# Patient Record
Sex: Female | Born: 1950 | Race: Black or African American | Hispanic: No | Marital: Married | Smoking: Former smoker
Health system: Southern US, Community
[De-identification: ages and names within clinical notes are randomized; demographics above are authoritative.]

## PROBLEM LIST (undated history)

## (undated) DIAGNOSIS — I10 Essential (primary) hypertension: Secondary | ICD-10-CM

## (undated) DIAGNOSIS — C801 Malignant (primary) neoplasm, unspecified: Secondary | ICD-10-CM

## (undated) DIAGNOSIS — H548 Legal blindness, as defined in USA: Secondary | ICD-10-CM

## (undated) HISTORY — DX: Malignant (primary) neoplasm, unspecified: C80.1

## (undated) HISTORY — PX: HERNIA REPAIR: SHX51

## (undated) HISTORY — DX: Legal blindness, as defined in USA: H54.8

## (undated) HISTORY — PX: BREAST SURGERY: SHX581

## (undated) HISTORY — DX: Essential (primary) hypertension: I10

---

## 2011-12-07 ENCOUNTER — Other Ambulatory Visit: Payer: Self-pay | Admitting: Obstetrics & Gynecology

## 2011-12-07 DIAGNOSIS — N949 Unspecified condition associated with female genital organs and menstrual cycle: Secondary | ICD-10-CM

## 2011-12-12 ENCOUNTER — Ambulatory Visit (HOSPITAL_COMMUNITY)
Admission: RE | Admit: 2011-12-12 | Discharge: 2011-12-12 | Disposition: A | Discharge status: Home / Self Care | Payer: Federal, State, Local not specified - PPO | Source: Ambulatory Visit | Attending: Obstetrics & Gynecology | Admitting: Obstetrics & Gynecology

## 2011-12-12 DIAGNOSIS — Z853 Personal history of malignant neoplasm of breast: Secondary | ICD-10-CM | POA: Insufficient documentation

## 2011-12-12 DIAGNOSIS — Z90710 Acquired absence of both cervix and uterus: Secondary | ICD-10-CM | POA: Insufficient documentation

## 2011-12-12 DIAGNOSIS — N949 Unspecified condition associated with female genital organs and menstrual cycle: Secondary | ICD-10-CM | POA: Insufficient documentation

## 2011-12-12 DIAGNOSIS — R1032 Left lower quadrant pain: Secondary | ICD-10-CM | POA: Insufficient documentation

## 2011-12-22 ENCOUNTER — Encounter (INDEPENDENT_AMBULATORY_CARE_PROVIDER_SITE_OTHER): Payer: Self-pay | Admitting: General Surgery

## 2012-01-03 ENCOUNTER — Ambulatory Visit (INDEPENDENT_AMBULATORY_CARE_PROVIDER_SITE_OTHER): Payer: Federal, State, Local not specified - PPO | Admitting: General Surgery

## 2012-01-03 ENCOUNTER — Encounter (INDEPENDENT_AMBULATORY_CARE_PROVIDER_SITE_OTHER): Payer: Self-pay | Admitting: General Surgery

## 2012-01-03 VITALS — BP 142/84 | HR 76 | Temp 97.4°F | Resp 16 | Ht 65.0 in | Wt 212.4 lb

## 2012-01-03 DIAGNOSIS — K429 Umbilical hernia without obstruction or gangrene: Secondary | ICD-10-CM

## 2012-01-03 NOTE — Progress Notes (Signed)
Subjective:     Patient ID: Lori Berry, female   DOB: November 21, 1950, 61 y.o.   MRN: [id number]  HPI The patient is 61 year old at 15 month history of left lateral abdominal wall fullness. Patient seen several physicians to have this evaluated. Patient has undergone ultrasound to evaluate her left ovary which is not cause of her fullness. Patient is a previous MRI in the past which according to the patient has revealed a left inguinal hernia. Patient she has no pain at that site but does recognize the fullness when she bends down. Patient also states she does have a herniated disc seen a neurologist and neurosurgeon for steroid injections.  Review of Systems  Constitutional: Negative.   HENT: Negative.   Eyes: Negative.   Respiratory: Negative.   Cardiovascular: Negative.   Gastrointestinal: Positive for abdominal pain (fullness to L lateral abd wall).  Neurological: Negative.        Objective:   Physical Exam  Constitutional: She is oriented to person, place, and time. She appears well-developed and well-nourished.  HENT:  Head: Normocephalic and atraumatic.  Eyes: Pupils are equal, round, and reactive to light.  Neck: Normal range of motion. Neck supple.  Cardiovascular: Normal rate and regular rhythm.   Pulmonary/Chest: Effort normal and breath sounds normal.  Abdominal: Soft. She exhibits no distension and no mass. There is no tenderness. There is no rebound and no guarding.  Musculoskeletal: Normal range of motion.  Neurological: She is alert and oriented to person, place, and time.       Assessment:     The patient is a 61 year old female with umbilical hernia and possible left abdominal wall hernia    Plan:     1. Patient is to obtain her previous MRI  in clinic for review. He is unable to attain this we will proceed with CT scan of the abdomen and pelvis.  2. If unable to localize the lateral abdominal hernial repair laparoscopically.

## 2012-01-18 ENCOUNTER — Ambulatory Visit (INDEPENDENT_AMBULATORY_CARE_PROVIDER_SITE_OTHER): Payer: Federal, State, Local not specified - PPO | Admitting: General Surgery

## 2012-01-18 ENCOUNTER — Encounter (INDEPENDENT_AMBULATORY_CARE_PROVIDER_SITE_OTHER): Payer: Self-pay | Admitting: General Surgery

## 2012-01-18 VITALS — BP 148/76 | HR 84 | Temp 97.7°F | Resp 20 | Ht 65.0 in | Wt 211.0 lb

## 2012-01-18 DIAGNOSIS — K429 Umbilical hernia without obstruction or gangrene: Secondary | ICD-10-CM

## 2012-01-18 NOTE — Progress Notes (Signed)
Patient ID: Lori Berry, female   DOB: [birth year], 61 y.o.   MRN: [id number]  Chief Complaint  Patient presents with  . Routine Post Op    hernia    HPI Lori Berry is a 61 y.o. female returns today with a CT of her CT abdomen and pelvis for evaluation of a left-sided fullness and she feels. Patient states this started several months ago and feels like there is a possible hernia to the left lateral abdominal wall. Upon review the CT scan patient has an umbilical fat-containing hernia however does not have any lateral abdominal wall hernia. I reviewed the findings with the patient and she wished to proceed with umbilical hernia present this time.  HPI  Past Medical History  Diagnosis Date  . Diabetes mellitus   . Hypertension   . Cancer     breast  . Legally blind     Past Surgical History  Procedure Date  . Cesarean section   . Breast surgery     lumpectomy    Family History  Problem Relation Age of Onset  . Heart failure Father     Social History History  Substance Use Topics  . Smoking status: Never Smoker   . Smokeless tobacco: Not on file  . Alcohol Use: No    Allergies  Allergen Reactions  . Morphine And Related   . Prednisone   . Sulfa Antibiotics     Current Outpatient Prescriptions  Medication Sig Dispense Refill  . amLODipine (NORVASC) 10 MG tablet Take 10 mg by mouth daily.      Marland Kitchen buPROPion (WELLBUTRIN) 100 MG tablet Take 100 mg by mouth as needed.       Marland Kitchen PRODIGY NO CODING BLOOD GLUC test strip       . simvastatin (ZOCOR) 10 MG tablet Take 10 mg by mouth at bedtime.        Review of Systems Review of Systems  Constitutional: Negative.   HENT: Negative.   Eyes: Negative.   Respiratory: Negative.   Cardiovascular: Negative.   Gastrointestinal: Negative.   Neurological: Negative.     Blood pressure 148/76, pulse 84, temperature 97.7 F (36.5 C), temperature source Temporal, resp. rate 20, height 5\' 5"  (1.651 m), weight 211 lb  (95.709 kg).  Physical Exam Physical Exam  Constitutional: She is oriented to person, place, and time. She appears well-developed and well-nourished.  HENT:  Head: Normocephalic and atraumatic.  Eyes: Conjunctivae normal and EOM are normal. Pupils are equal, round, and reactive to light.  Neck: Normal range of motion. Neck supple.  Cardiovascular: Normal rate and regular rhythm.   Pulmonary/Chest: Effort normal and breath sounds normal.  Abdominal: Soft. Bowel sounds are normal.         1 cm umbilical hernia with reducible fat.  Neurological: She is alert and oriented to person, place, and time.    Data Reviewed Outside CT scan of her abdomen and pelvis reveals fat-containing umbilical hernia.  Assessment    61 year old female with umbilical hernia approximately 1/2 cm in size.    Plan    1.  We'll proceed with laparoscopic umbilical hernia with mesh. 2.  All risks and benefits were discussed with the patient, to generally include infection, bleeding, damage to surrounding structures, and recurrence. Alternatives were offered and described.  All questions were answered and the patient voiced understanding of the procedure and wishes to proceed at this point.        [name]., [name] 01/18/2012, 11:12  AM    

## 2012-02-19 DIAGNOSIS — K429 Umbilical hernia without obstruction or gangrene: Secondary | ICD-10-CM

## 2012-03-07 ENCOUNTER — Ambulatory Visit (INDEPENDENT_AMBULATORY_CARE_PROVIDER_SITE_OTHER): Payer: Federal, State, Local not specified - PPO | Admitting: General Surgery

## 2012-03-07 ENCOUNTER — Encounter (INDEPENDENT_AMBULATORY_CARE_PROVIDER_SITE_OTHER): Payer: Self-pay | Admitting: General Surgery

## 2012-03-07 VITALS — BP 120/80 | HR 84 | Temp 97.0°F | Ht 65.0 in | Wt 210.4 lb

## 2012-03-07 DIAGNOSIS — Z9889 Other specified postprocedural states: Secondary | ICD-10-CM

## 2012-03-07 DIAGNOSIS — Z8719 Personal history of other diseases of the digestive system: Secondary | ICD-10-CM

## 2012-03-07 NOTE — Progress Notes (Signed)
Patient ID: Lori Berry, female   DOB: [birth year], 61 y.o.   MRN: [id number] Patient is a 61 year old female approximately 2 weeks out from an umbilical hernia repair. Patient has been doing well postoperatively and is minimal pain at her umbilicus.   Her wounds are clean dry and intact and healing well.She does have a healing ridge underneath her incision sites.  Patient follow back up in one month. Patient is to hold off on heavy lifting for another 4 weeks.

## 2012-04-04 ENCOUNTER — Encounter (INDEPENDENT_AMBULATORY_CARE_PROVIDER_SITE_OTHER): Payer: Federal, State, Local not specified - PPO | Admitting: General Surgery

## 2014-01-12 IMAGING — US US PELVIS COMPLETE
1 series · 14 of 25 positions shown · non-contrast
Comparison: No similar prior study is available for comparison.

CLINICAL DATA: Left lower quadrant pelvic pain.  Remote history of
lumpectomy for breast cancer 6886.  Prior hysterectomy.



[Series 1: us pelvis complete · 14 of 31 slices shown]
[im 1/31]
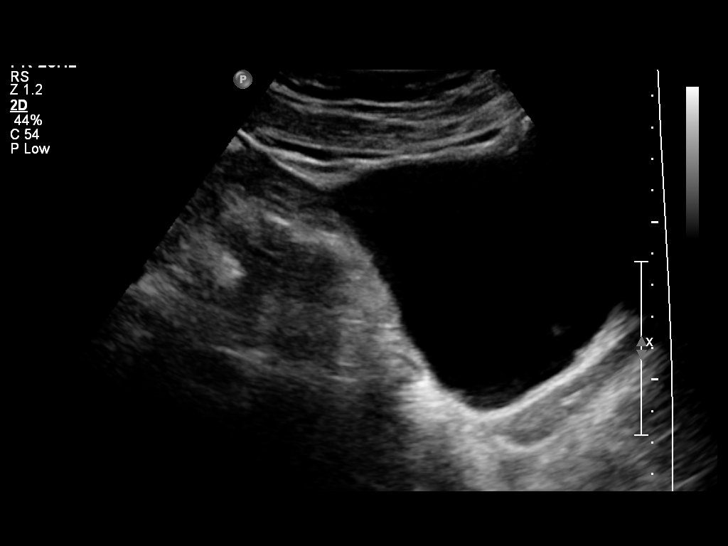
[im 3/31]
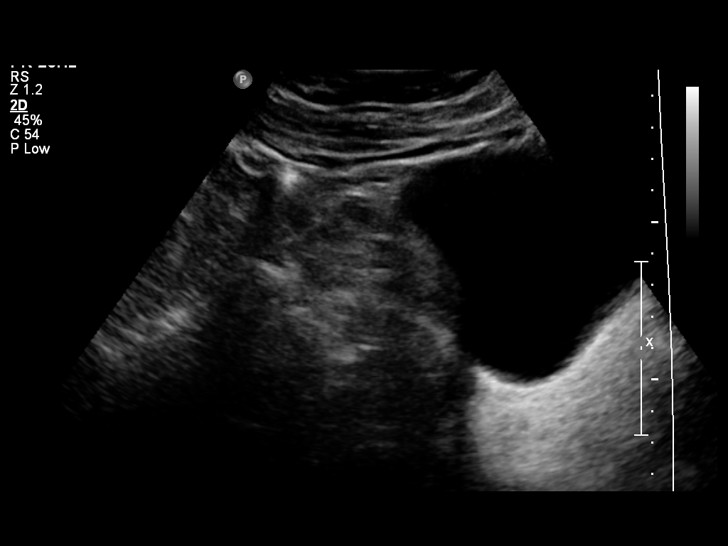
[im 6/31]
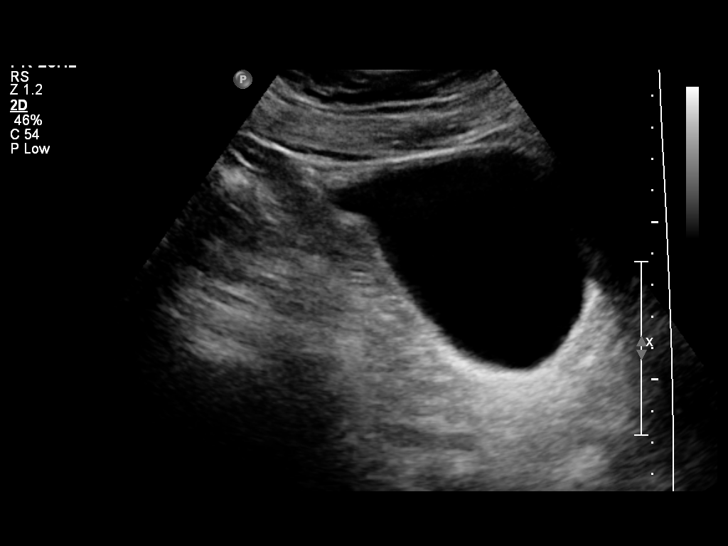
[im 8/31]
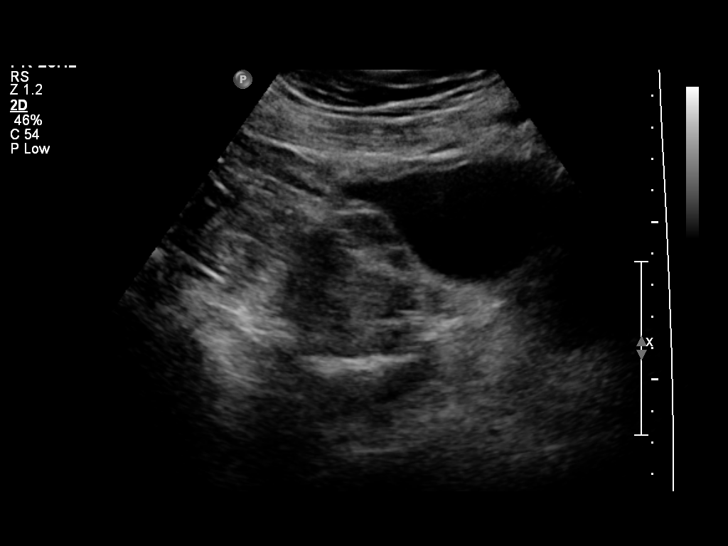
[im 11/31]
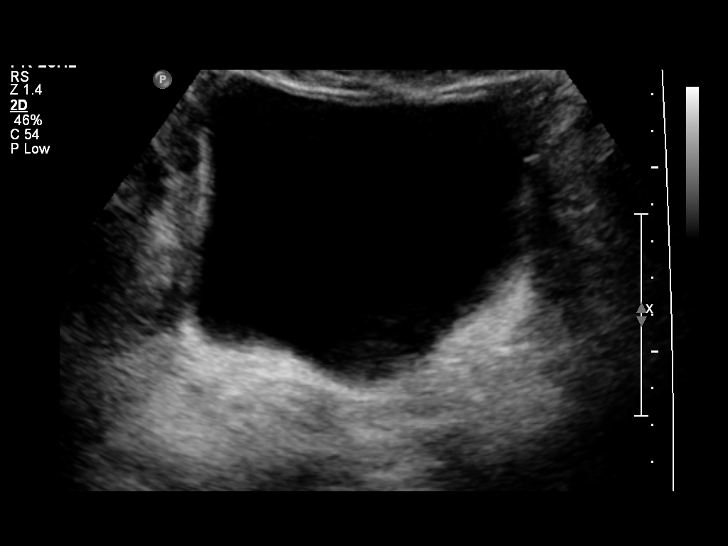
[im 12/31]
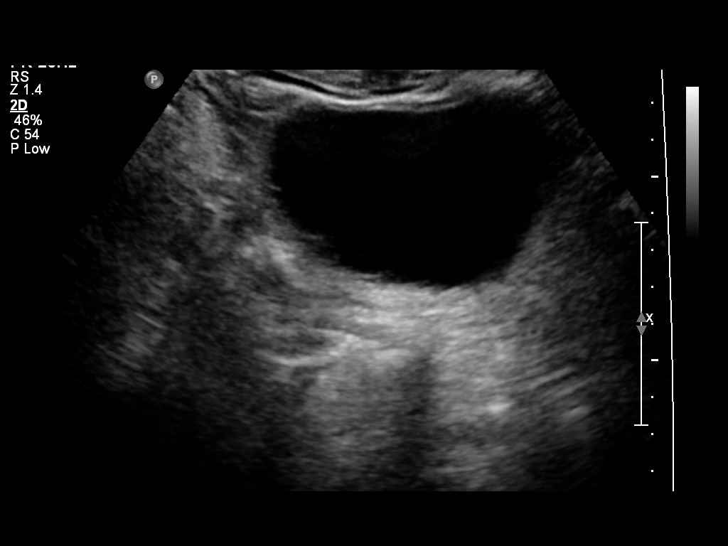
[im 14/31]
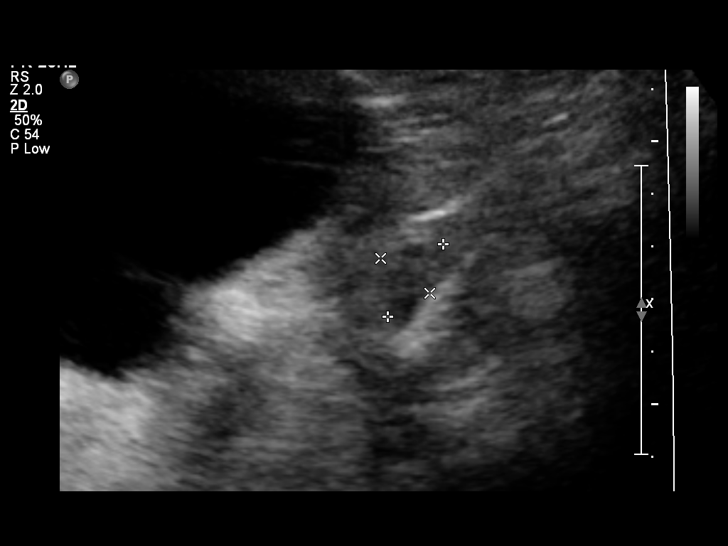
[im 17/31]
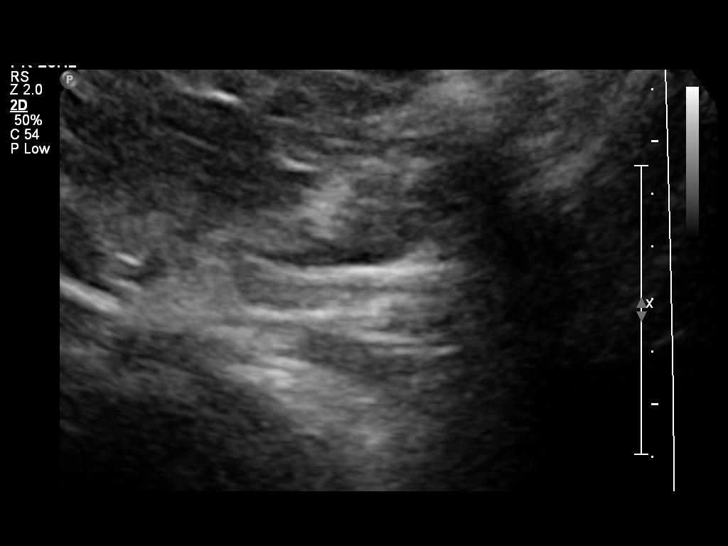
[im 19/31]
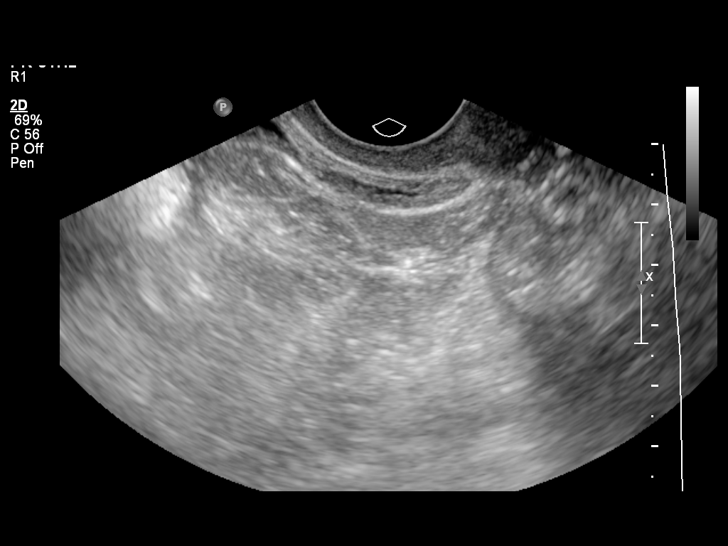
[im 21/31]
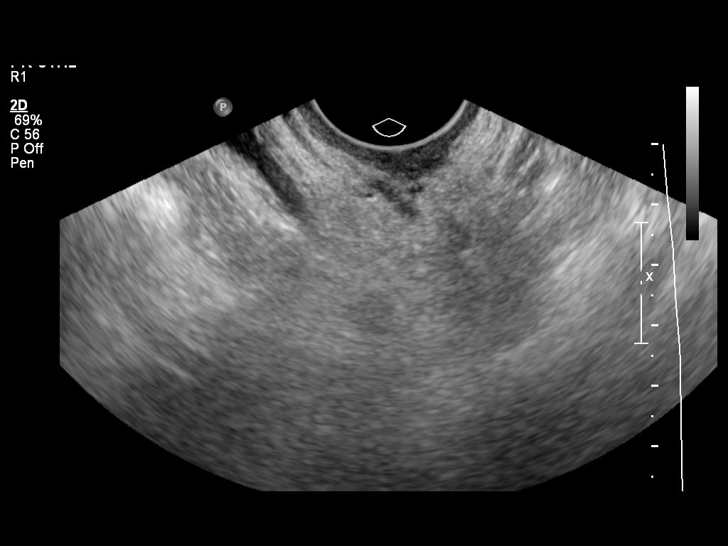
[im 23/31]
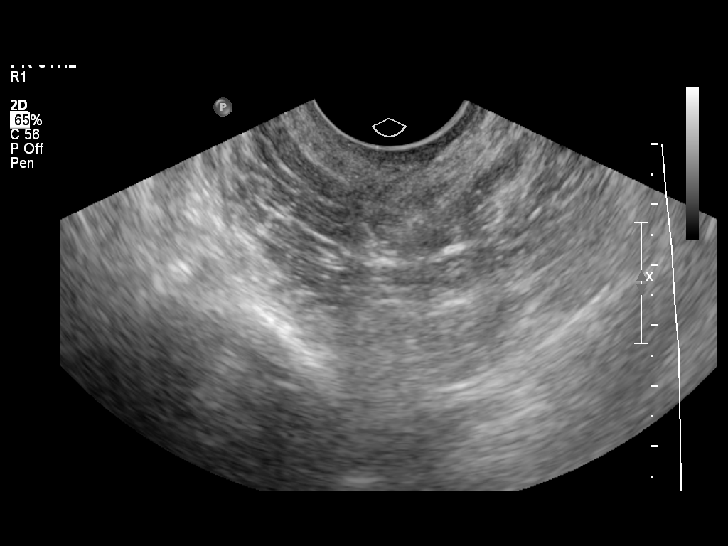
[im 26/31]
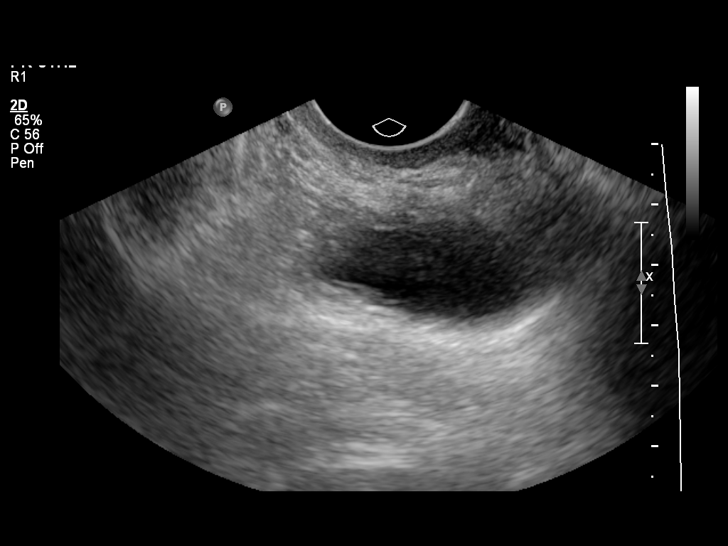
[im 28/31]
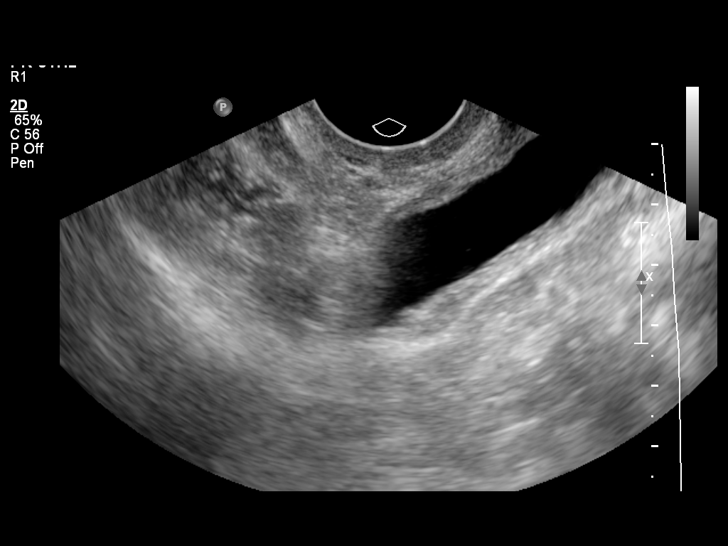
[im 31/31]
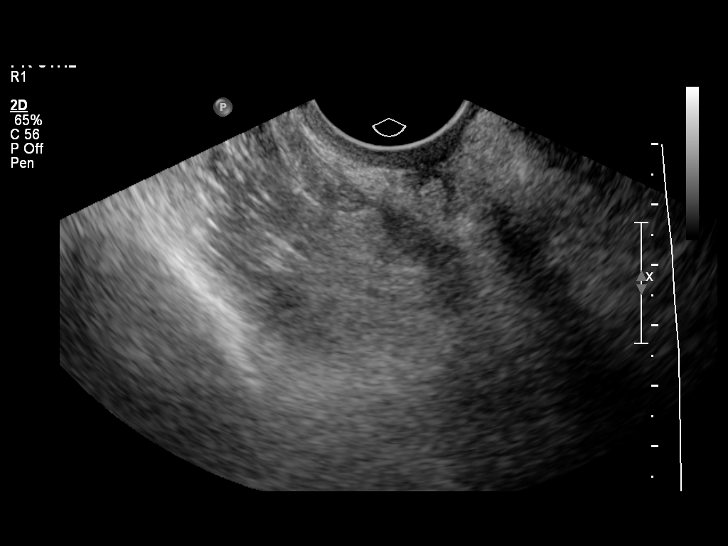

[14 of 25 positions shown; findings below may reference images not displayed]

FINDINGS: Uterus: Surgically absent.  No mass at the vaginal cuff.

Endometrium: Surgically absent.

Right ovary:  Not visualized.  No adnexal mass.

Left ovary: Seen transabdominally only, 1.7 x 1.4 x 1.3 cm.
Normal.

Other findings: No free fluid
IMPRESSION: No etiology for the history of right lower quadrant pelvic pain.
Normal post hysterectomy appearance.

Right ovary not visualized but no right adnexal mass identified..

## 2016-06-17 ENCOUNTER — Encounter (HOSPITAL_BASED_OUTPATIENT_CLINIC_OR_DEPARTMENT_OTHER): Payer: Self-pay

## 2016-06-17 ENCOUNTER — Emergency Department (HOSPITAL_BASED_OUTPATIENT_CLINIC_OR_DEPARTMENT_OTHER)
Admission: EM | Admit: 2016-06-17 | Discharge: 2016-06-17 | Disposition: A | Discharge status: Home / Self Care | Payer: Medicare Other | Attending: Emergency Medicine | Admitting: Emergency Medicine

## 2016-06-17 DIAGNOSIS — Z7984 Long term (current) use of oral hypoglycemic drugs: Secondary | ICD-10-CM | POA: Insufficient documentation

## 2016-06-17 DIAGNOSIS — M545 Low back pain: Secondary | ICD-10-CM | POA: Diagnosis present

## 2016-06-17 DIAGNOSIS — I10 Essential (primary) hypertension: Secondary | ICD-10-CM | POA: Insufficient documentation

## 2016-06-17 DIAGNOSIS — M5441 Lumbago with sciatica, right side: Secondary | ICD-10-CM | POA: Diagnosis not present

## 2016-06-17 DIAGNOSIS — Z79899 Other long term (current) drug therapy: Secondary | ICD-10-CM | POA: Diagnosis not present

## 2016-06-17 DIAGNOSIS — Z853 Personal history of malignant neoplasm of breast: Secondary | ICD-10-CM | POA: Insufficient documentation

## 2016-06-17 DIAGNOSIS — Z87891 Personal history of nicotine dependence: Secondary | ICD-10-CM | POA: Insufficient documentation

## 2016-06-17 DIAGNOSIS — E119 Type 2 diabetes mellitus without complications: Secondary | ICD-10-CM | POA: Insufficient documentation

## 2016-06-17 MED ORDER — CYCLOBENZAPRINE HCL 10 MG PO TABS: 10.0000 mg | ORAL_TABLET | Freq: Two times a day (BID) | ORAL | 0 refills | Status: DC | PRN

## 2016-06-17 MED ORDER — DICLOFENAC SODIUM 1 % TD GEL: 2.0000 g | Freq: Four times a day (QID) | TRANSDERMAL | 0 refills | Status: AC

## 2016-06-17 MED ORDER — CYCLOBENZAPRINE HCL 10 MG PO TABS
10.0000 mg | ORAL_TABLET | Freq: Once | ORAL | Status: AC
  Administered 2016-06-17: 10 mg via ORAL
  Filled 2016-06-17: qty 1

## 2016-06-17 NOTE — Discharge Instructions (Signed)
Please use Flexeril as needed for muscle pain and muscle spasm. Use warm compresses, stretching, low back exercises. Please follow-up with your primary care provider in one week regarding today's visit.  Contact a health care provider if: You have pain that is not relieved with rest or medicine. You have increasing pain going down into the legs or buttocks. You have pain that does not improve in one week. You have night pain. You lose weight. You have a fever or chills. Get help right away if: You develop new bowel or bladder control problems. You have unusual weakness or numbness in your arms or legs. You develop nausea or vomiting. You develop abdominal pain. You feel faint.

## 2016-06-17 NOTE — ED Triage Notes (Signed)
Pt c/o lumbar pain that radiates down R leg. Pt spent yesterday in the garden and afterwards is when the pain started. Pt denies incontinence or weakness in lower extremities. Pt noted to have slow steady gate in wheelchair at this time.

## 2016-06-17 NOTE — ED Notes (Signed)
Alert, NAD, calm, interactive, resps e/u, speaking in clear complete sentences, no dyspnea noted, skin W&D, EDPA in room, at [facility]. Pt standing at bedside speaking with provider.

## 2016-06-17 NOTE — ED Provider Notes (Signed)
[place] [facility] Provider Note   CSN: [id number] Arrival date & time: 06/17/16  1614   By signing my name below, I, Eunice Blase, attest that this documentation has been prepared under the direction and in the presence of Wai Minotti. Electronically signed, Eunice Blase, ED Scribe. 06/17/16. 8:28 PM.   History   Chief Complaint Chief Complaint  Patient presents with  . Back Pain   The history is provided by the patient and medical records. No language interpreter was used.    HPI Comments: Lori Berry is a 66 y.o. female with PMHx of Dm, HTN, legally blind, and disc herniation who presents to the Emergency Department complaining of worsened, chronic, constant 6/10 low back pain. Pt notes associated numbness on the RLE. 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 She adds the pain is normally to the L and center but this time it is R sided. She states today she bent forward to get something from the referigerator and noted electric pain radiating to the R thigh and calf. She notes the same happened during sneezing today. Pt notes pain is relieved in fetal position and with a pillow under her low back at bed time. She notes the pain is worse with BM, swinging the R leg forward and walking. Pt has taken Tylenol without relief. Hx of a herniated disc and breast cancer that has resolved without recurrence noted. Pt denies fever, abdominal pain, chills, hematuria, dysuria, incontinence and IV drug history.   Past Medical History:  Diagnosis Date  . Cancer (HCC)    breast  . Diabetes mellitus   . Hypertension   . Legally blind     Patient Active Problem List   Diagnosis Date Noted  . Umbilical hernia 0000000    Past Surgical History:  Procedure Laterality Date  . BREAST SURGERY     lumpectomy  . CESAREAN SECTION    . HERNIA REPAIR      OB History    No data available       Home Medications     Prior to Admission medications   Medication Sig Start Date End Date Taking? Authorizing Provider  amLODipine-benazepril (LOTREL) 10-20 MG capsule Take 1 capsule by mouth daily.   Yes Historical Provider, MD  fluticasone (FLONASE) 50 MCG/ACT nasal spray Place into both nostrils daily.   Yes Historical Provider, MD  metFORMIN (GLUCOPHAGE) 500 MG tablet Take 500 mg by mouth 2 (two) times daily with a meal.   Yes Historical Provider, MD  simvastatin (ZOCOR) 20 MG tablet Take 20 mg by mouth daily.   Yes Historical Provider, MD  amLODipine (NORVASC) 10 MG tablet Take 10 mg by mouth daily.    Historical Provider, MD  buPROPion (WELLBUTRIN) 100 MG tablet Take 100 mg by mouth as needed.     Historical Provider, MD  cyclobenzaprine (FLEXERIL) 10 MG tablet Take 1 tablet (10 mg total) by mouth 2 (two) times daily as needed for muscle spasms. 06/17/16   Shylynn Bruning Manuel [place], [place]  diclofenac sodium (VOLTAREN) 1 % GEL Apply 2 g topically 4 (four) times daily. 06/17/16   Denman Pichardo Manuel [place], [place]  PRODIGY NO CODING BLOOD GLUC test strip  12/13/11   Historical Provider, MD    Family History Family History  Problem Relation Age of Onset  . Heart failure Father     Social History Social History  Substance Use Topics  . Smoking status: Former Smoker  Quit date: 06/17/2009  . Smokeless tobacco: Never Used  . Alcohol use No     Allergies   Morphine and related; Other; Prednisone; and Sulfa antibiotics   Review of Systems Review of Systems  Constitutional: Positive for chills and fever.  Gastrointestinal: Negative for abdominal pain.  Genitourinary: Negative for dysuria, enuresis and hematuria.  Musculoskeletal: Positive for back pain. Negative for joint swelling and myalgias.  Neurological: Positive for numbness.  All other systems reviewed and are negative.    Physical Exam Updated Vital Signs BP (!) 126/101 (BP Location: Left Arm)   Pulse 80   Temp 98.6 F (37 C)   Resp 18   Ht 5'  5" (1.651 m)   Wt 197 lb (89.4 kg)   SpO2 98%   BMI 32.78 kg/m   Physical Exam  Constitutional: She is oriented to person, place, and time. She appears well-developed and well-nourished.  Well appearing  HENT:  Head: Normocephalic and atraumatic.  Nose: Nose normal.  Mouth/Throat: Oropharynx is clear and moist.  Eyes: EOM are normal. Pupils are equal, round, and reactive to light.  Neck: Normal range of motion.  Normal ROM, no neck tenderness. No nuchal rigidity  Cardiovascular: Normal rate, normal heart sounds and intact distal pulses.   Pulmonary/Chest: Effort normal and breath sounds normal. No respiratory distress.  Normal work of breathing  Abdominal: Soft. There is no tenderness. There is no rebound and no guarding.  Soft and nontender. No rebound or guarding. No pulsatile mass noted.   Musculoskeletal: She exhibits tenderness. She exhibits no deformity.  There is tenderness to right lower back. No midline cervical, thoracic, or lumbar tenderness. Good ROM of spine. No deformity. No obvious wound, redness, or swelling noted.   Neurological: She is alert and oriented to person, place, and time.  Cranial Nerves:  III,IV, VI: ptosis not present, extra-ocular movements intact bilaterally, direct and consensual pupillary light reflexes intact bilaterally V: facial sensation, jaw opening, and bite strength equal bilaterally VII: eyebrow raise, eyelid close, smile, frown, pucker equal bilaterally VIII: hearing grossly normal bilaterally  IX,X: palate elevation and swallowing intact XI: bilateral shoulder shrug and lateral head rotation equal and strong XII: midline tongue extension  Negative pronator drift, negative Romberg, negative RAM's, negative heel-to-shin, negative finger to nose.    Sensory intact.  Muscle strength 5/5 Patient able to ambulate without difficulty.   SLR Right -  neg SLR Left -  neg  Skin: Skin is warm.  Psychiatric: She has a normal mood and affect. Her  behavior is normal.  Nursing note and vitals reviewed.    ED Treatments / Results  DIAGNOSTIC STUDIES: Oxygen Saturation is 98% on RA, normal by my interpretation.    COORDINATION OF CARE: 7:42 PM Discussed treatment plan with pt at bedside and pt agreed to plan. Will Rx and order medications. Pt advised of at home treatment and further instructed to F/U with PCP if symptoms persist > 1 week.  Labs (all labs ordered are listed, but only abnormal results are displayed) Labs Reviewed - No data to display  EKG  EKG Interpretation None       Radiology No results found.  Procedures Procedures (including critical care time)  Medications Ordered in ED Medications  cyclobenzaprine (FLEXERIL) tablet 10 mg (10 mg Oral Given 06/17/16 2001)     Initial Impression / Assessment and Plan / ED Course  I have reviewed the triage vital signs and the nursing notes.  Pertinent labs & imaging results  that were available during my care of the patient were reviewed by me and considered in my medical decision making (see chart for details).    Patient with back pain.  No neurological deficits and normal neuro exam.  Patient is ambulatory.  No loss of bowel or bladder control.  No concern for cauda equina.  No fever, night sweats, weight loss, IVDA, no recent procedure to back. No urinary symptoms suggestive of UTI.  Supportive care and return precaution discussed. Pt given voltaren gel and flexeril to go home with.  Appears safe for discharge at this time. Follow up as indicated in discharge paperwork.  Patient seen and evaluated by Dr. Maryan Rued who agrees with assessment and plan.   I personally performed the services described in this documentation, which was scribed in my presence. The recorded information has been reviewed and is accurate.  Final Clinical Impressions(s) / ED Diagnoses   Final diagnoses:  Acute right-sided low back pain with right-sided sciatica    New  Prescriptions Discharge Medication List as of 06/17/2016  7:54 PM    START taking these medications   Details  cyclobenzaprine (FLEXERIL) 10 MG tablet Take 1 tablet (10 mg total) by mouth 2 (two) times daily as needed for muscle spasms., Starting Sat 06/17/2016, Print    diclofenac sodium (VOLTAREN) 1 % GEL Apply 2 g topically 4 (four) times daily., Starting Sat 06/17/2016, [place], [place] 06/17/16 [address] [place], [place] 06/17/16 2048    Blanchie Dessert, MD 06/18/16 1718

## 2020-12-10 ENCOUNTER — Emergency Department (HOSPITAL_BASED_OUTPATIENT_CLINIC_OR_DEPARTMENT_OTHER)
Admission: EM | Admit: 2020-12-10 | Discharge: 2020-12-10 | Disposition: A | Discharge status: Home / Self Care | Payer: Medicare Other | Attending: Emergency Medicine | Admitting: Emergency Medicine

## 2020-12-10 ENCOUNTER — Emergency Department (HOSPITAL_BASED_OUTPATIENT_CLINIC_OR_DEPARTMENT_OTHER): Payer: Medicare Other

## 2020-12-10 ENCOUNTER — Other Ambulatory Visit: Payer: Self-pay

## 2020-12-10 ENCOUNTER — Encounter (HOSPITAL_BASED_OUTPATIENT_CLINIC_OR_DEPARTMENT_OTHER): Payer: Self-pay | Admitting: Emergency Medicine

## 2020-12-10 DIAGNOSIS — S62664A Nondisplaced fracture of distal phalanx of right ring finger, initial encounter for closed fracture: Secondary | ICD-10-CM | POA: Diagnosis not present

## 2020-12-10 DIAGNOSIS — Z853 Personal history of malignant neoplasm of breast: Secondary | ICD-10-CM | POA: Diagnosis not present

## 2020-12-10 DIAGNOSIS — W010XXA Fall on same level from slipping, tripping and stumbling without subsequent striking against object, initial encounter: Secondary | ICD-10-CM | POA: Insufficient documentation

## 2020-12-10 DIAGNOSIS — I10 Essential (primary) hypertension: Secondary | ICD-10-CM | POA: Insufficient documentation

## 2020-12-10 DIAGNOSIS — E119 Type 2 diabetes mellitus without complications: Secondary | ICD-10-CM | POA: Diagnosis not present

## 2020-12-10 DIAGNOSIS — S6991XA Unspecified injury of right wrist, hand and finger(s), initial encounter: Secondary | ICD-10-CM

## 2020-12-10 DIAGNOSIS — Z87891 Personal history of nicotine dependence: Secondary | ICD-10-CM | POA: Diagnosis not present

## 2020-12-10 DIAGNOSIS — Y92009 Unspecified place in unspecified non-institutional (private) residence as the place of occurrence of the external cause: Secondary | ICD-10-CM | POA: Diagnosis not present

## 2020-12-10 DIAGNOSIS — Z7984 Long term (current) use of oral hypoglycemic drugs: Secondary | ICD-10-CM | POA: Insufficient documentation

## 2020-12-10 DIAGNOSIS — Z79899 Other long term (current) drug therapy: Secondary | ICD-10-CM | POA: Diagnosis not present

## 2020-12-10 NOTE — ED Triage Notes (Signed)
Patient fell at home and caught self with right hand, third and fourth fingers. Knuckles swollen.

## 2020-12-10 NOTE — ED Provider Notes (Signed)
[facility] EMERGENCY DEPARTMENT Provider Note   CSN: [id number] Arrival date & time: 12/10/20  1005     History Chief Complaint  Patient presents with   Finger Injury    Lori Berry is a 70 y.o. female who experienced a fall 2 days ago in her home.  The patient states that she was wearing slippers and slid straight forward into the wall bracing herself with her hands.  She never made contact with the ground.  States that her right middle and ring finger caught her weight.  Nails broke.  Over the next 2 days she attempted to ice her hand and take ibuprofen however the swelling continued and ROM of third and fourth fingers of the right hand decreased. She denies pain of any other digits, wrist or ribs.  Denies hitting her head or LOC.   Past Medical History:  Diagnosis Date   Cancer ([place])    breast   Diabetes mellitus    Hypertension    Legally blind     Patient Active Problem List   Diagnosis Date Noted   Umbilical hernia 0000000    Past Surgical History:  Procedure Laterality Date   BREAST SURGERY     lumpectomy   CESAREAN SECTION     HERNIA REPAIR       OB History   No obstetric history on file.     Family History  Problem Relation Age of Onset   Heart failure Father     Social History   Tobacco Use   Smoking status: Former    Types: Cigarettes    Quit date: 06/17/2009    Years since quitting: 11.4   Smokeless tobacco: Never  Substance Use Topics   Alcohol use: No   Drug use: No    Home Medications Prior to Admission medications   Medication Sig Start Date End Date Taking? Authorizing Provider  amLODipine (NORVASC) 10 MG tablet Take 10 mg by mouth daily.    [provider]  amLODipine-benazepril (LOTREL) 10-20 MG capsule Take 1 capsule by mouth daily.    [provider]  buPROPion (WELLBUTRIN) 100 MG tablet Take 100 mg by mouth as needed.     [provider]  cyclobenzaprine (FLEXERIL) 10 MG tablet Take 1  tablet (10 mg total) by mouth 2 (two) times daily as needed for muscle spasms. 06/17/16   Bettey Costa, PA  diclofenac sodium (VOLTAREN) 1 % GEL Apply 2 g topically 4 (four) times daily. 06/17/16   [place], Kemper Durie, PA  fluticasone (FLONASE) 50 MCG/ACT nasal spray Place into both nostrils daily.    [provider]  metFORMIN (GLUCOPHAGE) 500 MG tablet Take 500 mg by mouth 2 (two) times daily with a meal.    [provider]  PRODIGY NO CODING BLOOD GLUC test strip  12/13/11   [provider]  simvastatin (ZOCOR) 20 MG tablet Take 20 mg by mouth daily.    [provider]    Allergies    Morphine and related, Other, Prednisone, and Sulfa antibiotics  Review of Systems   Review of Systems  Constitutional:  Negative for fatigue.  Cardiovascular:  Negative for chest pain.  Musculoskeletal:  Negative for neck pain and neck stiffness.  Neurological:  Negative for dizziness, weakness, light-headedness and headaches.  Hematological:  Does not bruise/bleed easily.  Psychiatric/Behavioral:  Negative for confusion.   All other systems reviewed and are negative.  Physical Exam Updated Vital Signs There were no vitals taken  for this visit.  Physical Exam Vitals and nursing note reviewed.  Constitutional:      Appearance: Normal appearance.  HENT:     Head: Normocephalic and atraumatic.  Eyes:     General: No scleral icterus.    Conjunctiva/sclera: Conjunctivae normal.     Pupils: Pupils are equal, round, and reactive to light.  Pulmonary:     Effort: Pulmonary effort is normal. No respiratory distress.  Musculoskeletal:        General: Swelling present.     Cervical back: Normal range of motion. No rigidity.     Comments: Swelling noted to the third and fourth digits of the right hand.  Most swollen at the PIP of both fingers.  Minor swelling to the MCPs.  Limitation with flexion of the 3rd and 4th digits. No abnormalities with her left  hand.  Skin:    General: Skin is warm and dry.     Coloration: Skin is not pale.     Findings: No bruising, erythema or rash.  Neurological:     Mental Status: She is alert.  Psychiatric:        Mood and Affect: Mood normal.    ED Results / Procedures / Treatments   Labs (all labs ordered are listed, but only abnormal results are displayed) Labs Reviewed - No data to display  EKG None  Radiology DG Hand Complete Right  Result Date: 12/10/2020 CLINICAL DATA:  Fall.  Hand injury. EXAM: RIGHT HAND - COMPLETE 3+ VIEW COMPARISON:  None. FINDINGS: AP film suggests the presence of a nondisplaced corner fracture at the radial base of the ring finger distal phalanx. This is not evident on the other 2 views. Otherwise no acute fracture or dislocation evident. No worrisome lytic or sclerotic osseous abnormality. IMPRESSION: Probable nondisplaced corner fracture at the radial base of the ring finger distal phalanx. Correlate for point tenderness. Electronically Signed   By: Misty Stanley M.D.   On: 12/10/2020 11:22    Procedures Procedures   Medications Ordered in ED Medications - No data to display  ED Course  I have reviewed the triage vital signs and the nursing notes.  Pertinent labs & imaging results that were available during my care of the patient were reviewed by me and considered in my medical decision making (see chart for details).    MDM Rules/Calculators/A&P Patient is a 70 year old female who presented with a complaint of right hand pain and swelling after fall 2 days ago.  Patient fell face forward into a wall.  Did not hit her head or collapsed to the ground.  Has been treating with Advil and ice with no relief of swelling or pain.  Because of her complaints and my physical exam of swelling and limited range of motion I obtained an x-ray of her right hand.  The x-ray revealed a likely nondisplaced corner fracture at the radial base of the ring finger distal to the phalanx.   This is where she experiences the most tenderness to palpation.  Denies need for pain medication here.  She will be placed in a finger splint today with a referral to hand.  We discussed the importance of continued ice and NSAID however I informed her of the maximum daily dose of Advil.  She understands that she needs to follow-up in a week to ensure that she is healing correctly.  Return precautions discussed. Final Clinical Impression(s) / ED Diagnoses Final diagnoses:  Injury of finger of right hand, initial encounter  Rx / DC Orders Results and diagnoses were explained to the patient. Return precautions discussed in full. Patient had no additional questions and expressed complete understanding.     Rhae Hammock, PA-C 12/10/20 [place], [place], DO 12/10/20 1248

## 2022-04-21 ENCOUNTER — Emergency Department (HOSPITAL_BASED_OUTPATIENT_CLINIC_OR_DEPARTMENT_OTHER): Payer: Medicare PPO

## 2022-04-21 ENCOUNTER — Emergency Department (HOSPITAL_BASED_OUTPATIENT_CLINIC_OR_DEPARTMENT_OTHER)
Admission: EM | Admit: 2022-04-21 | Discharge: 2022-04-21 | Disposition: A | Discharge status: Home / Self Care | Payer: Medicare PPO | Attending: Emergency Medicine | Admitting: Emergency Medicine

## 2022-04-21 ENCOUNTER — Other Ambulatory Visit: Payer: Self-pay

## 2022-04-21 DIAGNOSIS — Z1152 Encounter for screening for COVID-19: Secondary | ICD-10-CM | POA: Insufficient documentation

## 2022-04-21 DIAGNOSIS — J069 Acute upper respiratory infection, unspecified: Secondary | ICD-10-CM | POA: Diagnosis not present

## 2022-04-21 DIAGNOSIS — R059 Cough, unspecified: Secondary | ICD-10-CM

## 2022-04-21 DIAGNOSIS — E119 Type 2 diabetes mellitus without complications: Secondary | ICD-10-CM | POA: Insufficient documentation

## 2022-04-21 DIAGNOSIS — Z79899 Other long term (current) drug therapy: Secondary | ICD-10-CM | POA: Diagnosis not present

## 2022-04-21 DIAGNOSIS — I10 Essential (primary) hypertension: Secondary | ICD-10-CM | POA: Insufficient documentation

## 2022-04-21 DIAGNOSIS — Z7984 Long term (current) use of oral hypoglycemic drugs: Secondary | ICD-10-CM | POA: Diagnosis not present

## 2022-04-21 LAB — RESP PANEL BY RT-PCR (RSV, FLU A&B, COVID)  RVPGX2
Influenza A by PCR: NEGATIVE
Influenza B by PCR: NEGATIVE
Resp Syncytial Virus by PCR: NEGATIVE
SARS Coronavirus 2 by RT PCR: NEGATIVE

## 2022-04-21 LAB — GROUP A STREP BY PCR: Group A Strep by PCR: NOT DETECTED

## 2022-04-21 MED ORDER — LORATADINE 10 MG PO TABS: 10.0000 mg | ORAL_TABLET | Freq: Every day | ORAL | 0 refills | Status: AC

## 2022-04-21 MED ORDER — AMOXICILLIN-POT CLAVULANATE 875-125 MG PO TABS: 1.0000 | ORAL_TABLET | Freq: Two times a day (BID) | ORAL | 0 refills | Status: AC

## 2022-04-21 MED ORDER — GUAIFENESIN 100 MG/5ML PO LIQD: 100.0000 mg | ORAL | 0 refills | Status: AC | PRN

## 2022-04-21 MED ORDER — ACETAMINOPHEN 325 MG PO TABS: 650.0000 mg | ORAL_TABLET | Freq: Once | ORAL | Status: DC | PRN

## 2022-04-21 NOTE — ED Notes (Signed)
Redness noted in the back of throat and pt. C/o soreness in the back of throat as well.

## 2022-04-21 NOTE — Discharge Instructions (Addendum)
You were seen in the emergency department today for URI with cough.  We have tested you for covid, flu, RSV, and strep, and these results were negative.  However, it is still likely that your symptoms are related to a different viral illness.  The tenderness you are feeling on the right side of your cheek may be a lymph node or mild inflammation of the parotid gland.  Both of these can accompany acute viral infections, and are monitored, however usually do not require further treatment than this.  Treatment is directed at relieving symptoms.  There is no cure for acute viral infections; it is usually best to let them run their course.  However, since your husband recently was diagnosed with community acquired pneumonia, and your history of diabetes and hypertension, it may still be beneficial to take a short course of Augmentin, an antibiotic.  This has been sent to your pharmacy.  Take as directed.  Symptoms usually last around 1-2 weeks, though cough may occasionally linger for up to 3-4 weeks.     Additional treatments may include:  Increased fluid intake. Sports drinks offer valuable electrolytes, sugars, and fluids.  Breathing heated mist or steam (vaporizer or shower).  Eating chicken soup or other clear broths, and maintaining good nutrition.  Getting plenty of rest.  Using lozenges, tea with honey, or warm salt water for cough relief/sore throat relief (Cepkaol or Halls lozenges are available over-the-counter) Increasing usage of your inhaler if you have asthma.   Take over-the-counter Benadryl, Zyrtec, or Claritin to decrease sinus secretions, with Mucinex to help break up remaining mucus Continue to alternate between Tylenol and ibuprofen for pain and fever control.  Please follow up with your primary care doctor in 5-7 days for recheck of ongoing symptoms.  Return to emergency department for emergent changing or worsening of symptoms.

## 2022-04-21 NOTE — ED Triage Notes (Signed)
Pt arrives pov, steady gait, c/o cough, HA, sore throat x 4 days

## 2022-04-21 NOTE — ED Provider Notes (Signed)
[facility] Provider Note   CSN: [id number] Arrival date & time: 04/21/22  1241     History  Chief Complaint  Patient presents with   Cough    Lori Berry is a 72 y.o. female with history of non-insulin dependent DMT2 and HTN presenting to ED with 4-5 days of URI symptoms.  These include productive cough, sore throat, nasal congestion, and ear pressure though no pain.  Denies chest pain or shortness of breath.  No hx of asthma or COPD.  Without neck stiffness or fevers, though endorses some chills.  Some discomfort with swallowing, though still able to without difficulty.  Husband recently diagnosed and treated for community acquired pneumonia.  Tried to self quarantine away from him though developed similar symptoms about 2-3 days later.  The history is provided by the patient and medical records.  Cough    Home Medications Prior to Admission medications   Medication Sig Start Date End Date Taking? Authorizing Provider  amoxicillin-clavulanate (AUGMENTIN) 875-125 MG tablet Take 1 tablet by mouth every 12 (twelve) hours. 04/21/22  Yes Prince Rome, PA-C  guaiFENesin (ROBITUSSIN) 100 MG/5ML liquid Take 5-10 mLs (100-200 mg total) by mouth every 4 (four) hours as needed for cough or to loosen phlegm. 04/21/22  Yes Prince Rome, PA-C  loratadine (CLARITIN) 10 MG tablet Take 1 tablet (10 mg total) by mouth daily. 04/21/22  Yes Prince Rome, PA-C  amLODipine (NORVASC) 10 MG tablet Take 10 mg by mouth daily.    [provider]  amLODipine-benazepril (LOTREL) 10-20 MG capsule Take 1 capsule by mouth daily.    [provider]  buPROPion (WELLBUTRIN) 100 MG tablet Take 100 mg by mouth as needed.     [provider]  cyclobenzaprine (FLEXERIL) 10 MG tablet Take 1 tablet (10 mg total) by mouth 2 (two) times daily as needed for muscle spasms. 06/17/16   Bettey Costa, PA  diclofenac sodium (VOLTAREN) 1 % GEL  Apply 2 g topically 4 (four) times daily. 06/17/16   Espina, Kemper Durie, PA  fluticasone (FLONASE) 50 MCG/ACT nasal spray Place into both nostrils daily.    [provider]  metFORMIN (GLUCOPHAGE) 500 MG tablet Take 500 mg by mouth 2 (two) times daily with a meal.    [provider]  PRODIGY NO CODING BLOOD GLUC test strip  12/13/11   [provider]  simvastatin (ZOCOR) 20 MG tablet Take 20 mg by mouth daily.    [provider]      Allergies    Morphine and related, Other, Prednisone, and Sulfa antibiotics    Review of Systems   Review of Systems  Respiratory:  Positive for cough.     Physical Exam Updated Vital Signs BP (!) 156/81 (BP Location: Right Arm)   Pulse 98   Temp 98.5 F (36.9 C) (Oral)   Resp 16   Ht '5\' 5"'$  (1.651 m)   Wt 90.7 kg   SpO2 97%   BMI 33.28 kg/m  Physical Exam Vitals and nursing note reviewed.  Constitutional:      General: She is not in acute distress.    Appearance: She is well-developed. She is not ill-appearing, toxic-appearing or diaphoretic.  HENT:     Head: Normocephalic and atraumatic.     Comments: Very mild tenderness and slight edema over right parotid.  No superficial redness, significant warmth, or palpable abscess.    Right Ear: Tympanic membrane, ear canal and external ear  normal.     Left Ear: Tympanic membrane, ear canal and external ear normal.     Nose: Congestion and rhinorrhea present.     Mouth/Throat:     Mouth: Mucous membranes are moist.     Pharynx: Oropharynx is clear.     Comments: Airway patent.  Mild tonsillar erythema, though without exudate, abscess, or edema.  Mildly erythematous uvula, though midline and without swelling.  Handling secretions without difficulty.  No tongue elevation or deviation.  No gum swelling. Eyes:     Conjunctiva/sclera: Conjunctivae normal.  Neck:     Comments: No meningismus or torticollis.  Very supple on exam. Cardiovascular:     Rate and Rhythm:  Normal rate and regular rhythm.     Pulses: Normal pulses.     Heart sounds: Normal heart sounds. No murmur heard. Pulmonary:     Effort: Pulmonary effort is normal. No respiratory distress.     Breath sounds: Normal breath sounds. No wheezing.     Comments: CTAB, able to communicate without difficulty, without increased respiratory effort.  No wheezing/rales.  Adequate air movement.  Equal chest rise. Chest:     Chest wall: No tenderness.  Abdominal:     Palpations: Abdomen is soft.     Tenderness: There is no abdominal tenderness.  Musculoskeletal:        General: No swelling.     Cervical back: Neck supple. No rigidity or tenderness.     Right lower leg: No edema.     Left lower leg: No edema.  Lymphadenopathy:     Cervical: Cervical adenopathy present.  Skin:    General: Skin is warm and dry.     Capillary Refill: Capillary refill takes less than 2 seconds.     Coloration: Skin is not jaundiced or pale.  Neurological:     Mental Status: She is alert and oriented to person, place, and time.     Gait: Gait normal.  Psychiatric:        Mood and Affect: Mood normal.     ED Results / Procedures / Treatments   Labs (all labs ordered are listed, but only abnormal results are displayed) Labs Reviewed  GROUP A STREP BY PCR  RESP PANEL BY RT-PCR (RSV, FLU A&B, COVID)  [id number]    EKG None  Radiology DG Chest 2 View  Result Date: 04/21/2022 CLINICAL DATA:  Cough and headache.  Sore throat for 4 days EXAM: CHEST - 2 VIEW COMPARISON:  None Available. FINDINGS: The heart size and mediastinal contours are within normal limits. Both lungs are clear. The visualized skeletal structures are unremarkable. Scattered degenerative changes along the spine. IMPRESSION: No acute cardiopulmonary disease. Electronically Signed   By: Jill Side M.D.   On: 04/21/2022 13:08    Procedures Procedures    Medications Ordered in ED Medications  acetaminophen (TYLENOL) tablet 650 mg (has no  administration in time range)    ED Course/ Medical Decision Making/ A&P Clinical Course as of 04/21/22 1718  Fri Apr 21, 2022  1643 DG Chest 2 View Negative  [AC]  1643 Resp panel by RT-PCR (RSV, Flu A&B, Covid) Anterior Nasal Swab Negative [AC]  1643 Group A Strep by PCR Negative  [AC]    Clinical Course User Index [AC] Prince Rome, PA-C                           Medical Decision Making Amount and/or Complexity of Data  Reviewed Labs:  Decision-making details documented in ED Course. Radiology: ordered. Decision-making details documented in ED Course.  Risk OTC drugs.   72 year old female presenting with 4 days of URI symptoms.  These include productive cough, sore throat, and sinus headache.  Possible recent sick contacts.  No hx of asthma or COPD.  Pt afebrile without tonsillar exudate, erythema, or swelling.  Low clinical suspicion for strep pharyngitis.  Satting at 95-98% on room air.  Hemodynamically stable.  Good air movement without wheezing or rales.  No accessory muscle use, tripoding, drooling, nasal flaring, or retractions appreciated.    CXR without acute findings..  Presents with mild cervical lymphadenopathy though without dysphagia.  Presentation non concerning for PTA or RPA.  No trismus or uvula deviation.  Pt able to drink water in ED without difficulty with intact air way.   Doubt meningitis.  Low clinical suspicion for otitis media or otitis externa or mastoiditis at this time.    Negative for covid, flu, RSV, and strep.  Clinical diagnosis of likely viral pharyngitis.  Abx not indicated for viral infection, however with history of diabetes and recent contact with husband treated for CAP, plan to provide short course of Augmentin for possible atypical bacterial infection.  Mild tenderness and minimal swelling over right parotid gland suggestive of parotiditis, which commonly accompanies acute viral infections.  This usually resolves on own without  intervention, recommend monitoring.  Pt does not appear dehydrated, but did discuss importance of water rehydration.  Recommended PCP follow up and conservative symptom management.  Pt also notes her mildly increased BP above her baseline.  Without symptoms suggestive of acute hypertensive crisis, recommend rediscussing with PCP and taking medications as directed.  Patient in NAD and in good condition at time of discharge.   After consideration the patient's encounter today, I do not feel today's workup suggests an emergent condition requiring admission or immediate intervention beyond what has been performed at this time.  Safe for discharge; instructed to return immediately for worsening symptoms, change in symptoms or any other concerns.  I have reviewed the patients home medicines and have made adjustments as needed.  Discussed course of treatment with the patient, whom demonstrated understanding.  Patient in agreement and has no further questions.    This chart was dictated using voice recognition software.  Despite best efforts to proofread,  errors can occur which can change the documentation meaning.        Final Clinical Impression(s) / ED Diagnoses Final diagnoses:  Upper respiratory tract infection, unspecified type  Cough, unspecified type    Rx / DC Orders ED Discharge Orders          Ordered    guaiFENesin (ROBITUSSIN) 100 MG/5ML liquid  Every 4 hours PRN        04/21/22 1701    loratadine (CLARITIN) 10 MG tablet  Daily        04/21/22 1701    amoxicillin-clavulanate (AUGMENTIN) 875-125 MG tablet  Every 12 hours        04/21/22 1705              Prince Rome, PA-C 17/61/60 1718    Lennice Sites, DO 04/21/22 2223

## 2023-01-11 IMAGING — DX DG HAND COMPLETE 3+V*R*
3 series · 3 of 3 positions shown · non-contrast
Comparison: None.

CLINICAL DATA: Fall.  Hand injury.

EXAM:
RIGHT HAND - COMPLETE 3+ VIEW

[hand ap]
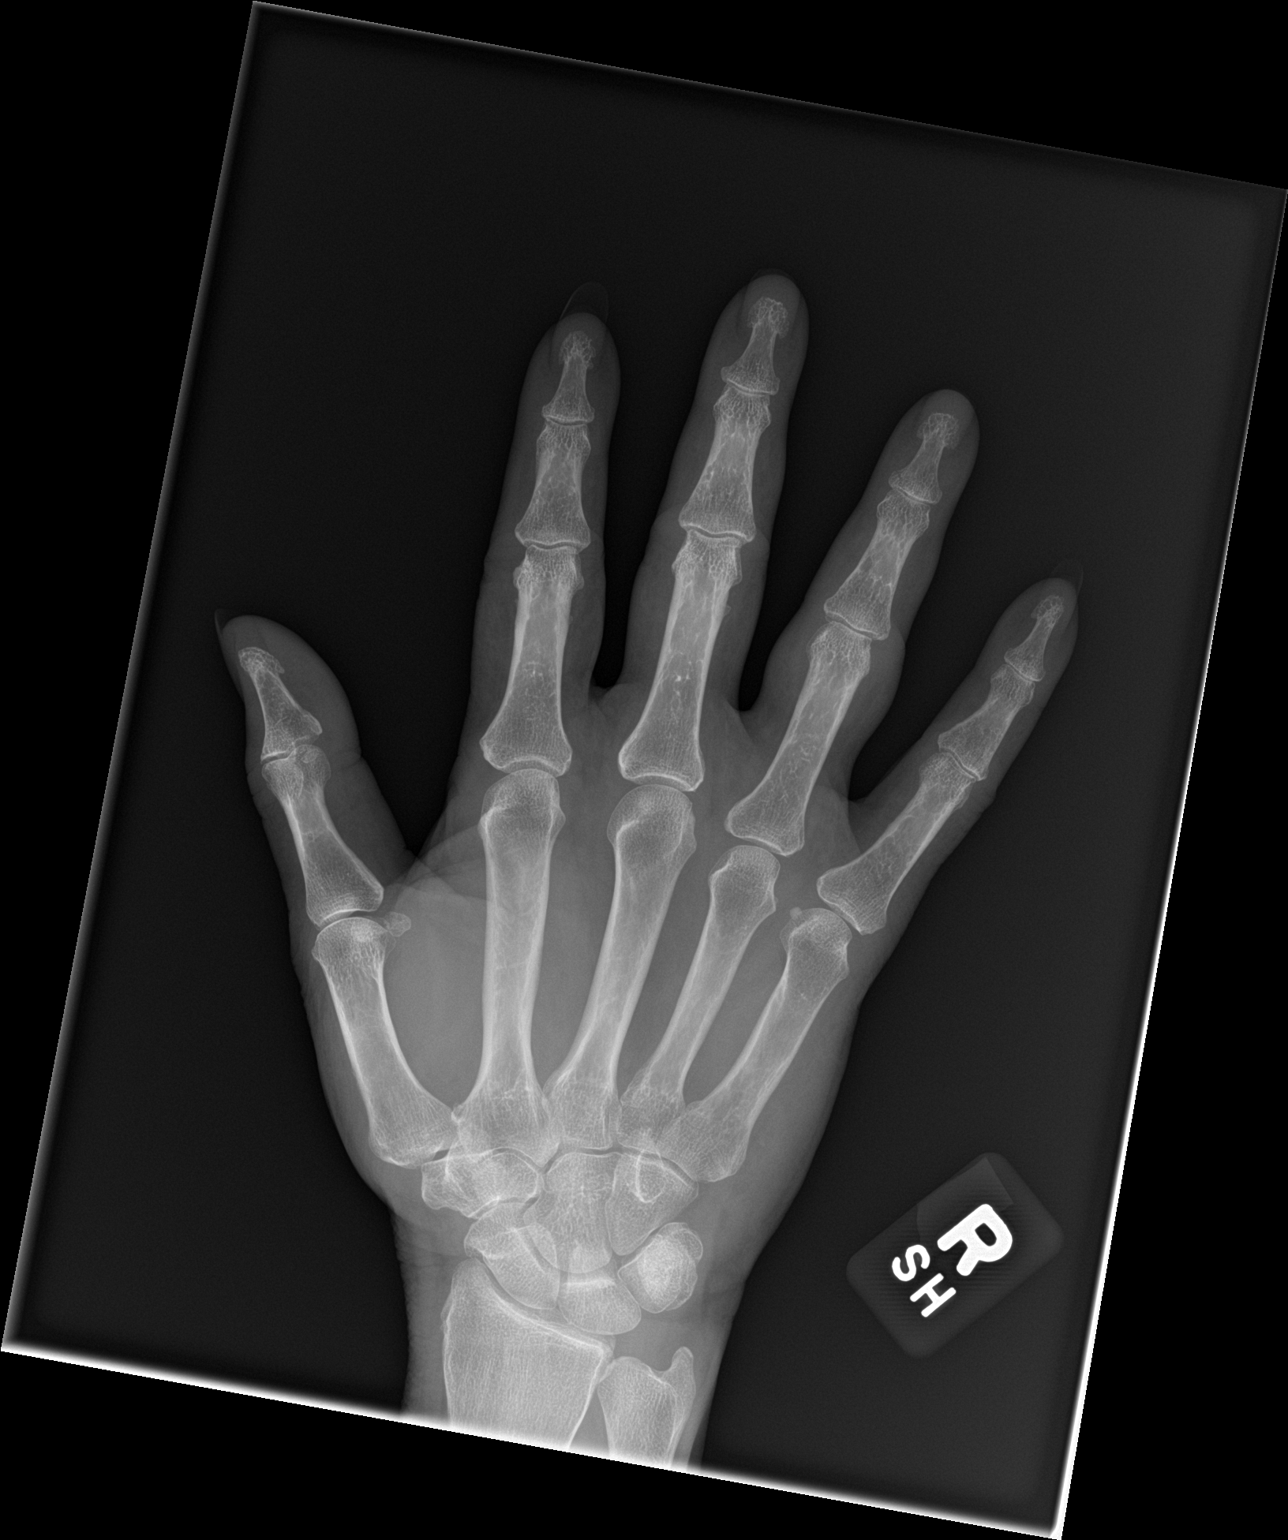

[hand obl]
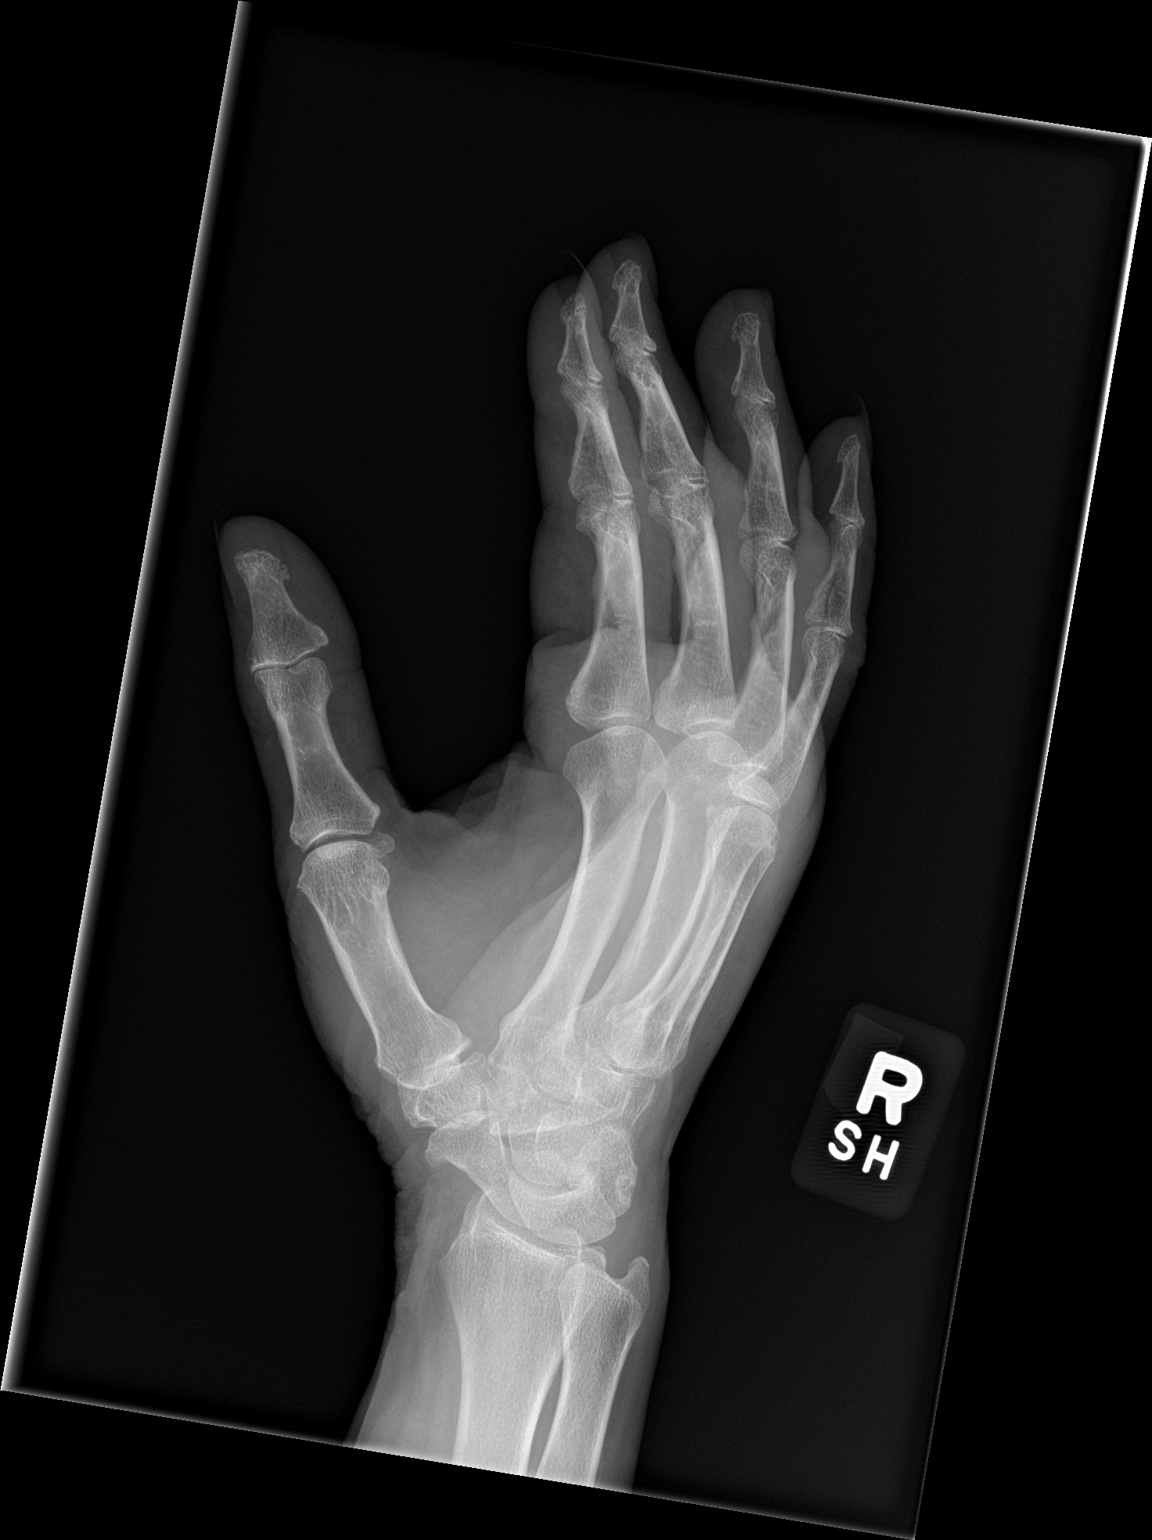

[hand lat]
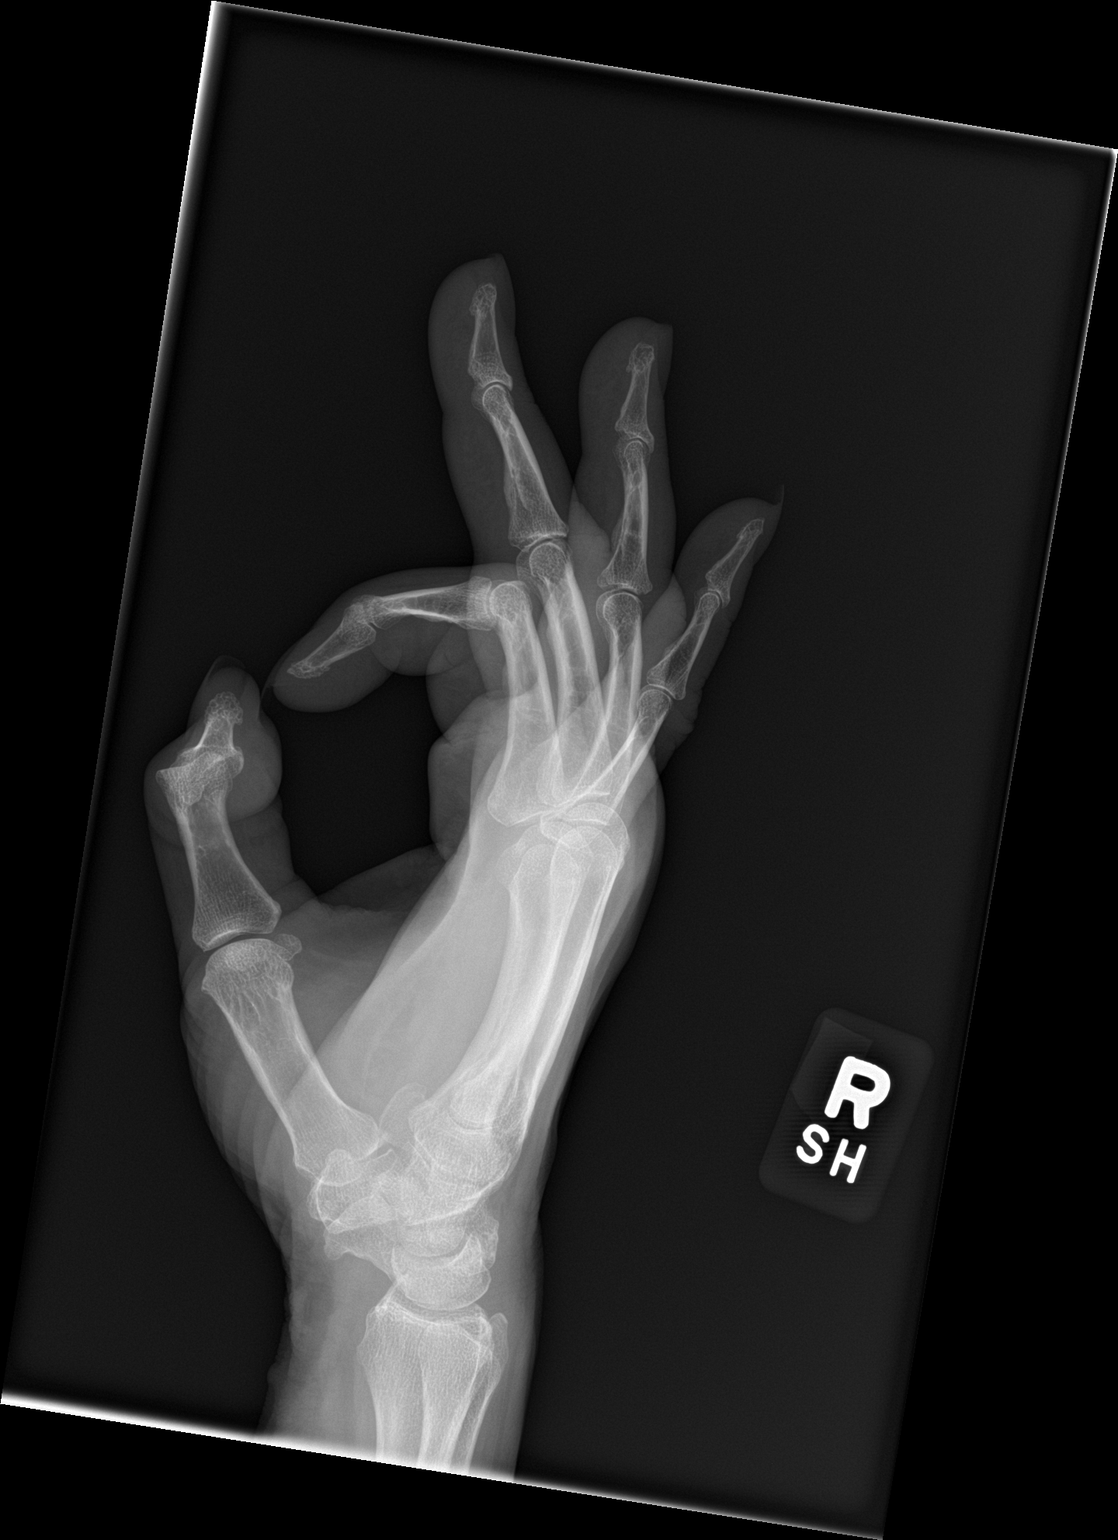

[3 of 3 positions shown; findings below may reference images not displayed]

FINDINGS: AP film suggests the presence of a nondisplaced corner fracture at
the radial base of the ring finger distal phalanx. This is not
evident on the other 2 views. Otherwise no acute fracture or
dislocation evident. No worrisome lytic or sclerotic osseous
abnormality.
IMPRESSION: Probable nondisplaced corner fracture at the radial base of the ring
finger distal phalanx. Correlate for point tenderness.

## 2023-11-02 ENCOUNTER — Emergency Department (HOSPITAL_BASED_OUTPATIENT_CLINIC_OR_DEPARTMENT_OTHER)
Admission: EM | Admit: 2023-11-02 | Discharge: 2023-11-02 | Disposition: A | Discharge status: Home / Self Care | Attending: Emergency Medicine | Admitting: Emergency Medicine

## 2023-11-02 ENCOUNTER — Encounter (HOSPITAL_BASED_OUTPATIENT_CLINIC_OR_DEPARTMENT_OTHER): Payer: Self-pay

## 2023-11-02 ENCOUNTER — Emergency Department (HOSPITAL_BASED_OUTPATIENT_CLINIC_OR_DEPARTMENT_OTHER)

## 2023-11-02 ENCOUNTER — Other Ambulatory Visit: Payer: Self-pay

## 2023-11-02 DIAGNOSIS — W19XXXA Unspecified fall, initial encounter: Secondary | ICD-10-CM | POA: Diagnosis not present

## 2023-11-02 DIAGNOSIS — M79675 Pain in left toe(s): Secondary | ICD-10-CM | POA: Insufficient documentation

## 2023-11-02 DIAGNOSIS — M25551 Pain in right hip: Secondary | ICD-10-CM | POA: Insufficient documentation

## 2023-11-02 DIAGNOSIS — M25562 Pain in left knee: Secondary | ICD-10-CM | POA: Insufficient documentation

## 2023-11-02 DIAGNOSIS — Z7984 Long term (current) use of oral hypoglycemic drugs: Secondary | ICD-10-CM | POA: Insufficient documentation

## 2023-11-02 DIAGNOSIS — S4991XA Unspecified injury of right shoulder and upper arm, initial encounter: Secondary | ICD-10-CM | POA: Insufficient documentation

## 2023-11-02 DIAGNOSIS — E119 Type 2 diabetes mellitus without complications: Secondary | ICD-10-CM | POA: Insufficient documentation

## 2023-11-02 DIAGNOSIS — M25561 Pain in right knee: Secondary | ICD-10-CM | POA: Diagnosis not present

## 2023-11-02 DIAGNOSIS — Z853 Personal history of malignant neoplasm of breast: Secondary | ICD-10-CM | POA: Insufficient documentation

## 2023-11-02 DIAGNOSIS — Z79899 Other long term (current) drug therapy: Secondary | ICD-10-CM | POA: Insufficient documentation

## 2023-11-02 DIAGNOSIS — I10 Essential (primary) hypertension: Secondary | ICD-10-CM | POA: Insufficient documentation

## 2023-11-02 DIAGNOSIS — M25521 Pain in right elbow: Secondary | ICD-10-CM | POA: Diagnosis not present

## 2023-11-02 MED ORDER — CYCLOBENZAPRINE HCL 10 MG PO TABS: 10.0000 mg | ORAL_TABLET | Freq: Two times a day (BID) | ORAL | 0 refills | Status: AC | PRN

## 2023-11-02 NOTE — Discharge Instructions (Addendum)
 It was a pleasure caring for you today in the emergency department.  Please follow-up with your PCP for recheck, you may benefit from orthopedic evaluation if symptoms persist  Please return to the emergency department for any worsening or worrisome symptoms.

## 2023-11-02 NOTE — ED Provider Notes (Signed)
  Provider Note MRN:  [id number]  Arrival date & time: 11/02/23    ED Course and Medical Decision Making  Assumed care from Dr Patsey at shift change.  See note from prior team for complete details, in brief:  Clinical Course as of 11/02/23 1611  Fri Nov 02, 2023  1507 Handoff NP Clemens x1 wk ago Pain right sided XR pending   [SG]    Clinical Course User Index [SG] Elnor Savant A, DO   Imaging is negative for fracture or dislocation.  She is feeling better.  Recommend continued supportive care at home, follow-up PCP.  May benefit from orthopedic evaluation if symptoms persist  Patient in no distress and overall condition is stable. Detailed discussions were had with the patient/guardian regarding current findings, and need for close f/u with PCP or on call doctor. The patient/guardian has been instructed to return immediately if the symptoms worsen in any way for re-evaluation. Patient/guardian verbalized understanding and is in agreement with current care plan. All questions answered prior to discharge.     Procedures  Final Clinical Impressions(s) / ED Diagnoses     ICD-10-CM   1. Injury of right shoulder, initial encounter  S49.91XA       ED Discharge Orders          Ordered    cyclobenzaprine  (FLEXERIL ) 10 MG tablet  2 times daily PRN        11/02/23 1610              Discharge Instructions      It was a pleasure caring for you today in the emergency department.  Please follow-up with your PCP for recheck, you may benefit from orthopedic evaluation if symptoms persist  Please return to the emergency department for any worsening or worrisome symptoms.          Elnor Savant LABOR, DO 11/02/23 1611

## 2023-11-02 NOTE — ED Triage Notes (Addendum)
 Patient states mechanical fall last Thursday. Right shoulder pain since. Also reports left  big toe and left knee pain.

## 2023-11-02 NOTE — ED Provider Notes (Signed)
 [facility] Provider Note   CSN: [id number] Arrival date & time: 11/02/23  1330     Patient presents with: Lori Berry is a 73 y.o. female.    Fall  Patient presents after fall.  Clemens just over a week ago.  Mechanical.  Lost her balance.  Complaint pain of right shoulder right elbow right hip right knee left great toe and apparently left knee pain. Did not have loss consciousness.  Not on blood thinners.  Has had continued pain and feels that the pain is mostly in the ball of the right shoulder.    Past Medical History:  Diagnosis Date   Cancer [facility])    breast   Diabetes mellitus    Hypertension    Legally blind     Prior to Admission medications   Medication Sig Start Date End Date Taking? Authorizing Provider  cyclobenzaprine  (FLEXERIL ) 10 MG tablet Take 1 tablet (10 mg total) by mouth 2 (two) times daily as needed for muscle spasms. 11/02/23  Yes Elnor Savant A, DO  amLODipine (NORVASC) 10 MG tablet Take 10 mg by mouth daily.    [provider]  amLODipine-benazepril (LOTREL) 10-20 MG capsule Take 1 capsule by mouth daily.    [provider]  amoxicillin -clavulanate (AUGMENTIN ) 875-125 MG tablet Take 1 tablet by mouth every 12 (twelve) hours. 04/21/22   Renae Bernarda HERO, PA-C  buPROPion (WELLBUTRIN) 100 MG tablet Take 100 mg by mouth as needed.     [provider]  diclofenac  sodium (VOLTAREN ) 1 % GEL Apply 2 g topically 4 (four) times daily. 06/17/16   Espina, Eric Lot, PA  fluticasone (FLONASE) 50 MCG/ACT nasal spray Place into both nostrils daily.    [provider]  guaiFENesin  (ROBITUSSIN) 100 MG/5ML liquid Take 5-10 mLs (100-200 mg total) by mouth every 4 (four) hours as needed for cough or to loosen phlegm. 04/21/22   Renae Bernarda HERO, PA-C  loratadine  (CLARITIN ) 10 MG tablet Take 1 tablet (10 mg total) by mouth daily. 04/21/22   Cockerham, Alicia M, PA-C  metFORMIN  (GLUCOPHAGE) 500 MG tablet Take 500 mg by mouth 2 (two) times daily with a meal.    [provider]  PRODIGY NO CODING BLOOD GLUC test strip  12/13/11   [provider]  simvastatin (ZOCOR) 20 MG tablet Take 20 mg by mouth daily.    [provider]    Allergies: Morphine and codeine, Other, Prednisone, and Sulfa antibiotics    Review of Systems  Updated Vital Signs BP (!) 183/79   Pulse 80   Temp 98.1 F (36.7 C) (Oral)   Resp 16   Ht 5' 4 (1.626 m)   Wt 90.7 kg   SpO2 98%   BMI 34.32 kg/m   Physical Exam Vitals reviewed.  HENT:     Head: Atraumatic.  Chest:     Chest wall: No tenderness.  Abdominal:     Tenderness: There is no abdominal tenderness.  Musculoskeletal:        General: Tenderness present.     Cervical back: Neck supple. No tenderness.     Comments: No cervical spine tenderness.  Does have tenderness over shoulder and potentially distal clavicle.  Some mild right elbow tenderness over radial head.  No wrist tenderness.  Some mild right hip tenderness.  Some mild right knee tenderness.  Mild left knee tenderness.  Mild tenderness over distal great toe on the left.  Neurological:     Mental Status: She is alert and oriented to person, place, and time.     (all labs ordered are listed, but only abnormal results are displayed) Labs Reviewed - No data to display  EKG: None  Radiology: DG Knee Complete 4 Views Left Result Date: 11/02/2023 CLINICAL DATA:  Fall, pain. EXAM: LEFT KNEE - COMPLETE 4+ VIEW COMPARISON:  None Available. FINDINGS: No fracture or joint effusion.  Trace patellofemoral osteophytosis. IMPRESSION: No acute findings. Electronically Signed   By: Newell Eke M.D.   On: 11/02/2023 15:06   DG Toe Great Left Result Date: 11/02/2023 CLINICAL DATA:  Fall, pain. EXAM: LEFT GREAT TOE COMPARISON:  None Available. FINDINGS: No acute osseous or joint abnormality. IMPRESSION: Negative. Electronically Signed   By: Newell Eke M.D.   On: 11/02/2023 15:06   DG Hip Unilat W or Wo Pelvis 2-3 Views Right Result Date: 11/02/2023 CLINICAL DATA:  Fall, pain. EXAM: DG HIP (WITH OR WITHOUT PELVIS) 2-3V RIGHT COMPARISON:  None Available. FINDINGS: No acute fracture or dislocation. Bilateral superomedial joint space narrowing with subchondral sclerosis and cyst formation as well as osteophytosis. Sacroiliac joints are patent. Degenerative changes in the visualized spine. IMPRESSION: 1. No acute findings. 2. 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 Electronically Signed   By: Newell Eke M.D.   On: 11/02/2023 15:05   DG Knee Complete 4 Views Right Result Date: 11/02/2023 CLINICAL DATA:  Fall, pain. EXAM: RIGHT KNEE - COMPLETE 4+ VIEW COMPARISON:  None Available. FINDINGS: No joint effusion or fracture.  Trace patellofemoral osteophytosis. IMPRESSION: No acute findings. Electronically Signed   By: Newell Eke M.D.   On: 11/02/2023 15:05   DG Elbow Complete Right Result Date: 11/02/2023 CLINICAL DATA:  Fall, pain. EXAM: RIGHT ELBOW - COMPLETE 3+ VIEW COMPARISON:  None Available. FINDINGS: Mild soft tissue swelling over the olecranon. No underlying fracture. IMPRESSION: Mild soft tissue swelling over the olecranon.  No fracture. Electronically Signed   By: Newell Eke M.D.   On: 11/02/2023 15:04   DG Shoulder Right Result Date: 11/02/2023 CLINICAL DATA:  Fall with right shoulder pain. EXAM: RIGHT SHOULDER - 2+ VIEW COMPARISON:  None Available. FINDINGS: No acute osseous or joint abnormality. Lucencies projecting over the lateral scapula appear to be due to skin folds. Mild degenerative changes in the shoulder. Visualized right chest is grossly unremarkable. IMPRESSION: 1. No acute findings. 2. Mild degenerative changes in the shoulder. Electronically Signed   By: Newell Eke M.D.   On: 11/02/2023 15:04     Procedures   Medications Ordered in the ED - No data to display  Clinical Course as of 11/03/23  0650  Fri Nov 02, 2023  1507 Handoff NP [facility]ens x1 wk ago Pain right sided XR pending   [SG]    Clinical Course User Index [SG] Elnor Jayson LABOR, DO                                 Medical Decision Making Amount and/or Complexity of Data Reviewed Radiology: ordered.  Risk Prescription drug management.   Patient with fall.  Did not hit head.  Has been about a week.  Complaining of pain in shoulder and various other extremities.  Will get x-ray imaging of the area she is complaining of.  Differential diagnose include sprain strains and dislocations.  Care turned over to oncoming provider     Final diagnoses:  Injury  of right shoulder, initial encounter    ED Discharge Orders          Ordered    cyclobenzaprine  (FLEXERIL ) 10 MG tablet  2 times daily PRN        11/02/23 1610               Patsey Lot, MD 11/03/23 [phone]
